# Patient Record
Sex: Female | Born: 1955 | Race: White | Hispanic: No | Marital: Married
Health system: Southern US, Community
[De-identification: ages and names within clinical notes are randomized; demographics above are authoritative.]

---

## 1998-06-28 ENCOUNTER — Other Ambulatory Visit: Admission: RE | Admit: 1998-06-28 | Discharge: 1998-06-28 | Payer: Self-pay | Admitting: Obstetrics & Gynecology

## 1998-12-12 ENCOUNTER — Other Ambulatory Visit: Admission: RE | Admit: 1998-12-12 | Discharge: 1998-12-12 | Payer: Self-pay | Admitting: Obstetrics & Gynecology

## 1999-01-09 ENCOUNTER — Other Ambulatory Visit: Admission: RE | Admit: 1999-01-09 | Discharge: 1999-01-09 | Payer: Self-pay | Admitting: Obstetrics & Gynecology

## 1999-07-04 ENCOUNTER — Other Ambulatory Visit: Admission: RE | Admit: 1999-07-04 | Discharge: 1999-07-04 | Payer: Self-pay | Admitting: Obstetrics & Gynecology

## 2000-07-01 ENCOUNTER — Other Ambulatory Visit: Admission: RE | Admit: 2000-07-01 | Discharge: 2000-07-01 | Payer: Self-pay | Admitting: Obstetrics & Gynecology

## 2001-07-01 ENCOUNTER — Other Ambulatory Visit: Admission: RE | Admit: 2001-07-01 | Discharge: 2001-07-01 | Payer: Self-pay | Admitting: Obstetrics & Gynecology

## 2002-06-30 ENCOUNTER — Other Ambulatory Visit: Admission: RE | Admit: 2002-06-30 | Discharge: 2002-06-30 | Payer: Self-pay | Admitting: Obstetrics & Gynecology

## 2003-07-13 ENCOUNTER — Other Ambulatory Visit: Admission: RE | Admit: 2003-07-13 | Discharge: 2003-07-13 | Payer: Self-pay | Admitting: Obstetrics & Gynecology

## 2004-07-18 ENCOUNTER — Other Ambulatory Visit: Admission: RE | Admit: 2004-07-18 | Discharge: 2004-07-18 | Payer: Self-pay | Admitting: Obstetrics & Gynecology

## 2005-02-12 ENCOUNTER — Ambulatory Visit: Payer: Self-pay | Admitting: Family Medicine

## 2005-05-15 ENCOUNTER — Ambulatory Visit: Payer: Self-pay | Admitting: Family Medicine

## 2005-07-22 ENCOUNTER — Other Ambulatory Visit: Admission: RE | Admit: 2005-07-22 | Discharge: 2005-07-22 | Payer: Self-pay | Admitting: Obstetrics & Gynecology

## 2005-07-30 ENCOUNTER — Encounter: Admission: RE | Admit: 2005-07-30 | Discharge: 2005-07-30 | Payer: Self-pay | Admitting: Obstetrics & Gynecology

## 2005-09-12 ENCOUNTER — Encounter: Admission: RE | Admit: 2005-09-12 | Discharge: 2005-09-12 | Payer: Self-pay | Admitting: Obstetrics & Gynecology

## 2005-10-24 ENCOUNTER — Ambulatory Visit: Payer: Self-pay | Admitting: Family Medicine

## 2007-01-18 ENCOUNTER — Emergency Department (HOSPITAL_COMMUNITY): Admission: EM | Admit: 2007-01-18 | Discharge: 2007-01-18 | Payer: Self-pay | Admitting: Emergency Medicine

## 2007-02-18 ENCOUNTER — Ambulatory Visit: Payer: Self-pay | Admitting: Internal Medicine

## 2007-02-18 LAB — CONVERTED CEMR LAB
BUN: 14 mg/dL (ref 6–23)
Basophils Absolute: 0.1 10*3/uL (ref 0.0–0.1)
Basophils Relative: 0.8 % (ref 0.0–1.0)
CO2: 29 meq/L (ref 19–32)
Calcium: 9.9 mg/dL (ref 8.4–10.5)
Chloride: 107 meq/L (ref 96–112)
Creatinine, Ser: 0.8 mg/dL (ref 0.4–1.2)
Eosinophils Absolute: 0 10*3/uL (ref 0.0–0.6)
Eosinophils Relative: 0.5 % (ref 0.0–5.0)
GFR calc Af Amer: 98 mL/min
GFR calc non Af Amer: 81 mL/min
Glucose, Bld: 104 mg/dL — ABNORMAL HIGH (ref 70–99)
HCT: 39.7 % (ref 36.0–46.0)
Hemoglobin: 13.5 g/dL (ref 12.0–15.0)
Lymphocytes Relative: 24.1 % (ref 12.0–46.0)
MCHC: 33.9 g/dL (ref 30.0–36.0)
MCV: 91.8 fL (ref 78.0–100.0)
Monocytes Absolute: 0.3 10*3/uL (ref 0.2–0.7)
Monocytes Relative: 4.7 % (ref 3.0–11.0)
Neutro Abs: 4.7 10*3/uL (ref 1.4–7.7)
Neutrophils Relative %: 69.9 % (ref 43.0–77.0)
Platelets: 225 10*3/uL (ref 150–400)
Potassium: 3.6 meq/L (ref 3.5–5.1)
Pro B Natriuretic peptide (BNP): 37 pg/mL (ref 0.0–100.0)
RBC: 4.32 M/uL (ref 3.87–5.11)
RDW: 12.7 % (ref 11.5–14.6)
Sodium: 145 meq/L (ref 135–145)
TSH: 1.44 microintl units/mL (ref 0.35–5.50)
WBC: 6.7 10*3/uL (ref 4.5–10.5)

## 2008-12-18 IMAGING — CR DG CHEST 2V
2 series · 2 of 2 positions shown · non-contrast
Comparison: None.

CLINICAL DATA: Chest pain. 
 CHEST - 2 VIEW [DATE]/[id number] HOURS:

[w chest pa]
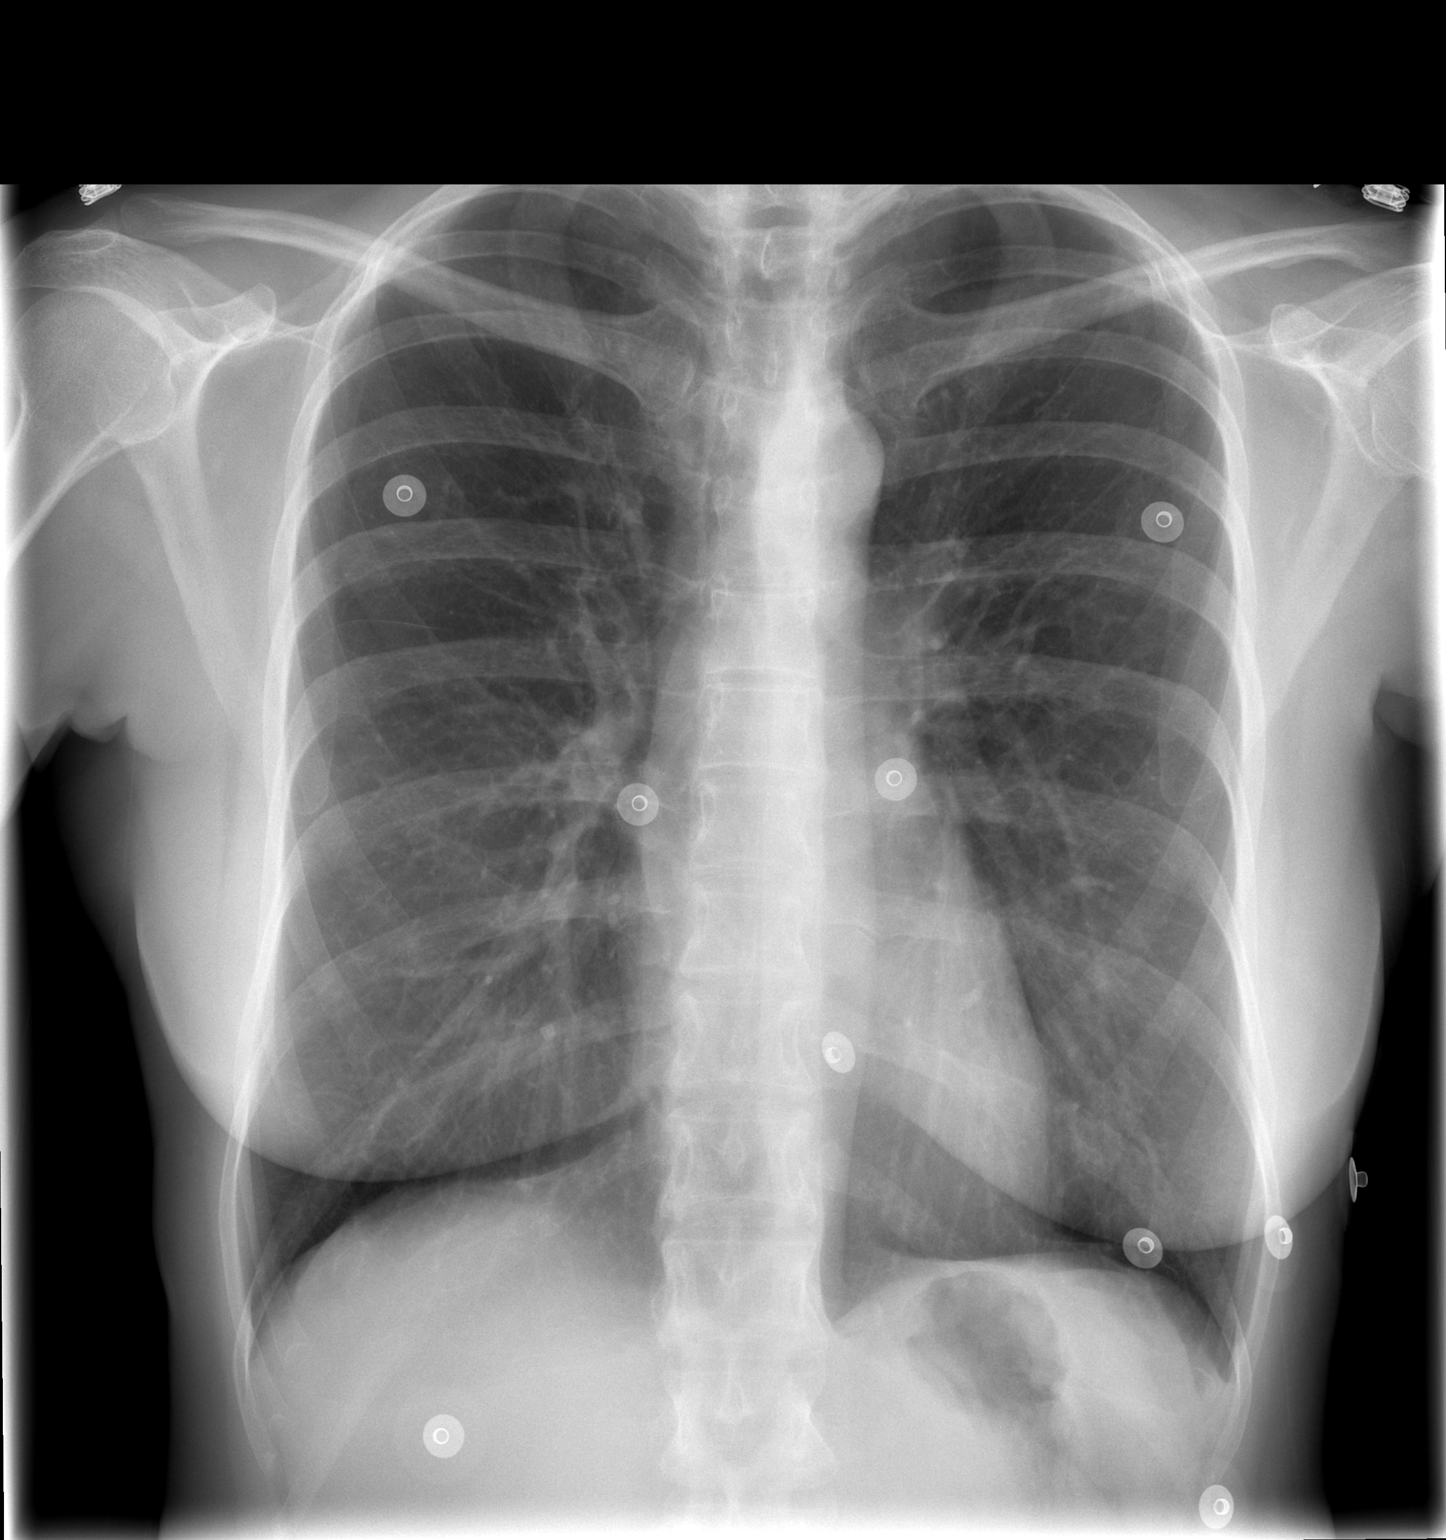

[w chest lat]
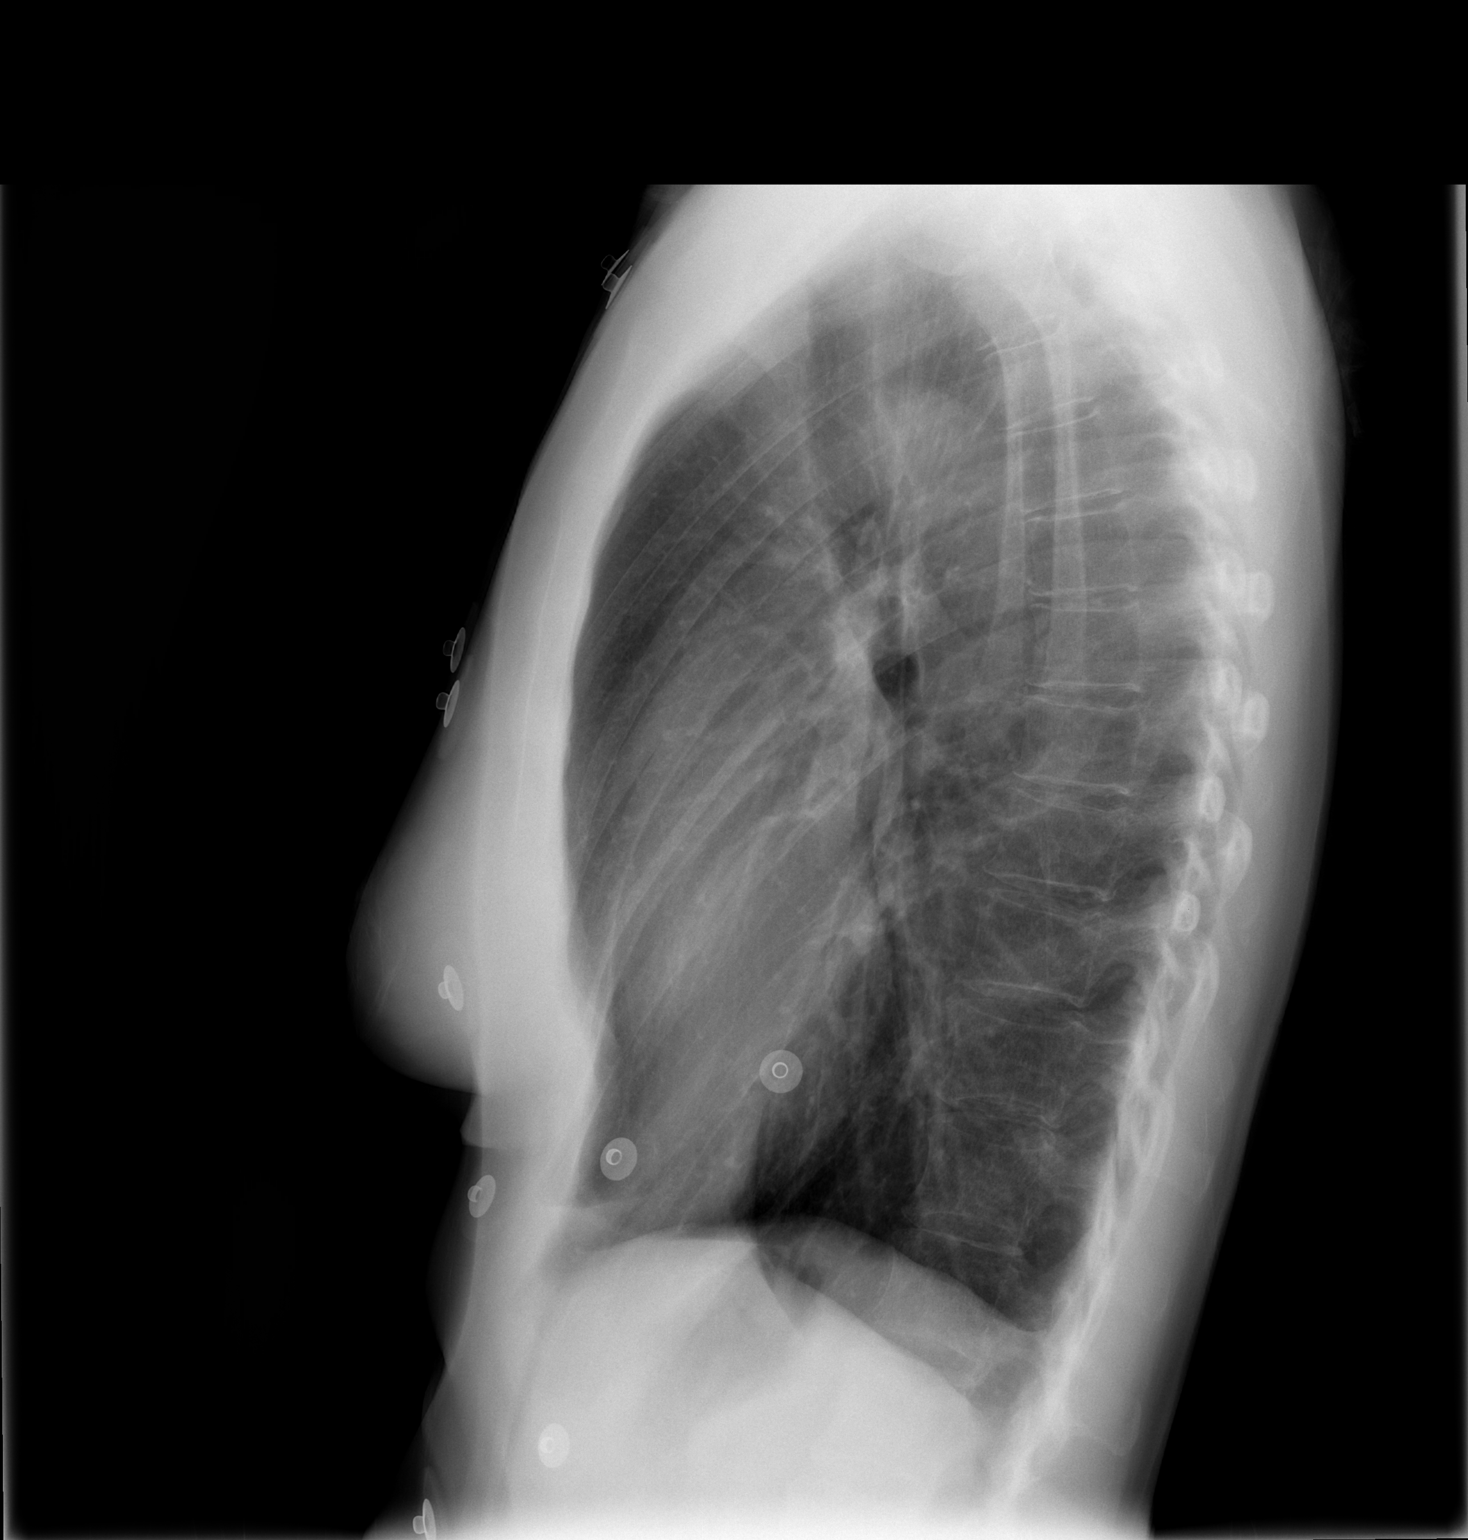

[2 of 2 positions shown; findings below may reference images not displayed]

FINDINGS: The lungs are hyperaerated, a feature of COPD. No pneumothorax, effusion, mass or consolidation is present. The heart is normal in size. The bones are demineralized without compression deformity.
IMPRESSION: Hyperinflation, a feature of COPD.

## 2014-11-02 ENCOUNTER — Ambulatory Visit (INDEPENDENT_AMBULATORY_CARE_PROVIDER_SITE_OTHER): Payer: BC Managed Care – PPO | Admitting: Neurology

## 2014-11-02 ENCOUNTER — Ambulatory Visit (INDEPENDENT_AMBULATORY_CARE_PROVIDER_SITE_OTHER): Payer: Self-pay | Admitting: Neurology

## 2014-11-02 DIAGNOSIS — R202 Paresthesia of skin: Secondary | ICD-10-CM

## 2014-11-02 NOTE — Progress Notes (Signed)
   [facility]  Provider:  Dr Jaynee Eagles Referring Provider: Janie Morning, DO Primary Care Physician:  Janie Morning, DO  History:  Joann Hall is a 57 y.o. female here as a referral from Dr. Theda Sers for evaluation of back pain. The symptoms started with a sharp pain in the left toe and tingling around the toenail. It has progressed to involve the right toes as well. She has numbness in the 4th toe of both feet. Does not have low back pain regularly. She has some radicular symptoms down the front of the lower legs. Symptoms worse with use, she exercises a lot. Balance is worsening, no falls but she has stumbled. Gets cramping in the legs sometimes.She has cold feet.  Notes from provider mentions that patient reported lumbosacral pain with stiffness, numbness, burning and tingling that started 3 years ago. Focused exam demonstrates 5/5 lower extremity strength. Brisk symmetric reflexes.   Summary  Nerve conduction studies were performed on the Bilateral lower extremities: Left peroneal motor nerve showed reduced amplitude (.93 mV, N>2.0) with normal F Wave latency. Right Peroneal motor nerve showed normal conductions with normal F Wave latency Tibial motor nerves showed normal conductions with normal F Wave latencies  Sural and Peroneal sensory nerves were within normal limits Bilateral H Reflexes showed normal latencies  EMG Needle study was performed on selected left upper and bilateral paraspinal muscles:   The left Vastus Medialis, left Anterior Tibialis, left Medial Gastrocnemius, left Extensor Hallucis Longus, left Abductor Hallucis, left Abductor Digiti Quinti,  left Biceps Femoris (long and short heads), left Gluteus Medius, left Gluteus Maximus and Bilateral S1/L5/L4 paraspinal muscles were within normal limits.  Conclusion: This is an abnormal study. The reduced amplitude of the left peroneal motor nerve could be seen with a non-localizing peroneal neuropathy or a  left L5 radiculopathy.  Given normal sensory potentials, favor a root-level lesion.  Sarina Ill, MD  [facility] [address] [place], [place] [address]  Phone [phone] Fax [phone]

## 2014-11-02 NOTE — Progress Notes (Signed)
[facility]   Provider: Dr Jaynee Eagles  Referring Provider: Janie Morning, DO  Primary Care Physician: Janie Morning, DO   History: Joann Hall is a 58 y.o. female here as a referral from Dr. Theda Sers for evaluation of back pain. The symptoms started with a sharp pain in the left toe and tingling around the toenail. It has progressed to involve the right toes as well. She has numbness in the 4th toe of both feet. Does not have low back pain regularly. She has some radicular symptoms down the front of the lower legs. Symptoms worse with use, she exercises a lot. Balance is worsening, no falls but she has stumbled. Gets cramping in the legs sometimes.She has cold feet. Notes from provider mentions that patient reported lumbosacral pain with stiffness, numbness, burning and tingling that started 3 years ago. Focused exam demonstrates 5/5 lower extremity strength. Brisk symmetric reflexes.   Summary   Nerve conduction studies were performed on the Bilateral lower extremities:  Left peroneal motor nerve showed reduced amplitude (.93 mV, N>2.0) with normal F Wave latency.  Right Peroneal motor nerve showed normal conductions with normal F Wave latency  Tibial motor nerves showed normal conductions with normal F Wave latencies  Sural and Peroneal sensory nerves were within normal limits  Bilateral H Reflexes showed normal latencies    EMG Needle study was performed on selected left upper and bilateral paraspinal muscles:  The left Vastus Medialis, left Anterior Tibialis, left Medial Gastrocnemius, left Extensor Hallucis Longus, left Abductor Hallucis, left Abductor Digiti Quinti, left Biceps Femoris (long and short heads), left Gluteus Medius, left Gluteus Maximus and Bilateral S1/L5/L4 paraspinal muscles were within normal limits.    Conclusion: This is an abnormal study. The reduced amplitude of the left peroneal motor nerve could be seen with a non-localizing peroneal neuropathy or a  left L5 radiculopathy. Given normal sensory potentials, favor a root-level lesion.    Sarina Ill, MD  [facility]  [address] [place]  [place], [place] [address]  Phone [phone] Fax [phone]

## 2016-05-06 DIAGNOSIS — F419 Anxiety disorder, unspecified: Secondary | ICD-10-CM | POA: Insufficient documentation

## 2016-05-06 DIAGNOSIS — F410 Panic disorder [episodic paroxysmal anxiety] without agoraphobia: Secondary | ICD-10-CM | POA: Insufficient documentation

## 2016-05-06 DIAGNOSIS — R922 Inconclusive mammogram: Secondary | ICD-10-CM | POA: Insufficient documentation

## 2016-05-06 DIAGNOSIS — F5105 Insomnia due to other mental disorder: Secondary | ICD-10-CM | POA: Insufficient documentation

## 2016-05-06 DIAGNOSIS — M1710 Unilateral primary osteoarthritis, unspecified knee: Secondary | ICD-10-CM | POA: Insufficient documentation

## 2016-05-06 DIAGNOSIS — I10 Essential (primary) hypertension: Secondary | ICD-10-CM | POA: Insufficient documentation

## 2017-04-09 DIAGNOSIS — H6981 Other specified disorders of Eustachian tube, right ear: Secondary | ICD-10-CM | POA: Insufficient documentation

## 2017-09-16 DIAGNOSIS — F331 Major depressive disorder, recurrent, moderate: Secondary | ICD-10-CM | POA: Insufficient documentation

## 2017-09-16 DIAGNOSIS — F419 Anxiety disorder, unspecified: Secondary | ICD-10-CM | POA: Insufficient documentation

## 2018-03-20 ENCOUNTER — Encounter: Payer: Self-pay | Admitting: Gastroenterology

## 2018-06-17 DIAGNOSIS — D72829 Elevated white blood cell count, unspecified: Secondary | ICD-10-CM | POA: Insufficient documentation

## 2018-09-23 DIAGNOSIS — R5382 Chronic fatigue, unspecified: Secondary | ICD-10-CM | POA: Insufficient documentation

## 2019-04-01 DIAGNOSIS — D72819 Decreased white blood cell count, unspecified: Secondary | ICD-10-CM

## 2019-07-23 DIAGNOSIS — L989 Disorder of the skin and subcutaneous tissue, unspecified: Secondary | ICD-10-CM | POA: Insufficient documentation

## 2020-01-13 DIAGNOSIS — Z01419 Encounter for gynecological examination (general) (routine) without abnormal findings: Secondary | ICD-10-CM | POA: Insufficient documentation

## 2020-06-27 DIAGNOSIS — R300 Dysuria: Secondary | ICD-10-CM | POA: Insufficient documentation

## 2020-12-28 DIAGNOSIS — Z0000 Encounter for general adult medical examination without abnormal findings: Secondary | ICD-10-CM | POA: Insufficient documentation

## 2021-06-28 DIAGNOSIS — F419 Anxiety disorder, unspecified: Secondary | ICD-10-CM | POA: Diagnosis not present

## 2021-06-28 DIAGNOSIS — F410 Panic disorder [episodic paroxysmal anxiety] without agoraphobia: Secondary | ICD-10-CM | POA: Diagnosis not present

## 2021-06-28 DIAGNOSIS — F331 Major depressive disorder, recurrent, moderate: Secondary | ICD-10-CM | POA: Diagnosis not present

## 2021-06-28 DIAGNOSIS — F5105 Insomnia due to other mental disorder: Secondary | ICD-10-CM | POA: Diagnosis not present

## 2021-06-28 DIAGNOSIS — I10 Essential (primary) hypertension: Secondary | ICD-10-CM | POA: Diagnosis not present

## 2021-07-31 ENCOUNTER — Ambulatory Visit: Payer: PPO | Admitting: Sports Medicine

## 2021-08-01 ENCOUNTER — Ambulatory Visit: Payer: PPO | Admitting: Sports Medicine

## 2021-08-01 ENCOUNTER — Encounter: Payer: Self-pay | Admitting: Sports Medicine

## 2021-08-01 ENCOUNTER — Other Ambulatory Visit: Payer: Self-pay

## 2021-08-01 DIAGNOSIS — M79672 Pain in left foot: Secondary | ICD-10-CM

## 2021-08-01 DIAGNOSIS — M792 Neuralgia and neuritis, unspecified: Secondary | ICD-10-CM | POA: Diagnosis not present

## 2021-08-01 DIAGNOSIS — M79671 Pain in right foot: Secondary | ICD-10-CM

## 2021-08-01 NOTE — Patient Instructions (Signed)
Nervive nerve relief supplement for your nerves can be purchased OTC at [facility]  

## 2021-08-01 NOTE — Progress Notes (Addendum)
Subjective: KAYDYNCE PAT is a 65 y.o. female patient who presents to office for evaluation of abnormal sensation to the bottoms of both feet feels like she has sandpaper stuck to the bottoms states that her toes are also cramping and this has started since the first week of September states that she also had an issue with dry skin but that improved with moisture with lotion and states that the tingling goes up to her ankles and has slowly started to affect her balance.  Patient currently is very active and does yoga and other exercises but still has difficulty with holding pulses and balance.  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     calcium carbonate (OS-CAL) 600 MG TABS tablet Take 600 mg by mouth 2 (two) times daily with a meal.     Carboxymethylcellul-Glycerin (REFRESH OPTIVE OP) Apply to eye.     clonazePAM (KLONOPIN) 2 MG tablet Take one and one half tablets daily for anxiety     Fexofenadine HCl (WAL-FEX ALLERGY PO) Take by mouth.     fluticasone (FLONASE) 50 MCG/ACT nasal spray Place 2 sprays into both nostrils daily.     losartan (COZAAR) 50 MG tablet Take 1 tablet by mouth daily.     Omega 3 1000 MG CAPS Take by mouth.     psyllium (METAMUCIL) 58.6 % powder Take 1 packet by  mouth 3 (three) times daily.     QUEtiapine (SEROQUEL) 100 MG tablet Take 1 tablet by mouth daily with the 25 mg at night     Melatonin 5 MG CHEW Chew 2 tablets by mouth at bedtime.     No current facility-administered medications on file prior to visit.    No Known Allergies  Objective:  General: Alert and oriented x3 in no acute distress  Dermatology: No open lesions bilateral lower extremities, no webspace macerations, no ecchymosis bilateral, all nails x 10 are well manicured.  Vascular: Dorsalis Pedis and Posterior Tibial pedal pulses faintly palpable, Capillary Fill Time 5 seconds, scant pedal hair growth bilateral, no edema bilateral lower extremities, Temperature gradient decreased slightly bilateral with mild varicosities noted.  Neurology: Johney Maine sensation intact via light touch bilateral, Protective sensation intact with Thornell Mule Monofilament to all pedal sites, vibratory sensation slightly diminished bilateral.  No babinski sign present bilateral.  Subjective awkward feeling of numbness to toes tingling and sandpaper feeling to the bottoms of both feet.  Musculoskeletal: No reproducible tenderness to palpation bilateral however clinically patient does have hammertoe deformity and fat pad atrophy noted plantarly bilateral.  Assessment and Plan: Problem List Items Addressed This Visit   None Visit Diagnoses     Neuritis    -  Primary   Relevant Orders   CBC with Differential/Platelet   Basic Metabolic Panel   Sedimentation rate  C-reactive protein   Rheumatoid factor   ANA   Hemoglobin A1c   HLA-B27 Antigen   Vitamin B12   Thyroid Panel With TSH   Foot pain, bilateral            -Complete examination performed -Discussed treatment options for what sounds like neuritis with the early beginnings of neuropathy possible small versus large fiber upon chart review I do see that there is a note for paresthesias from [facility] from 2015 that  identifies issues at that time with likely lumbar radiculopathy that could be the cause of issues however patient seems to have remotely forgotten about having any previous nerve testing or nerve work-up before -Rx blood work to evaluate the extent of neuropathy -Recommend patient to try over-the-counter Nervive nerve relief -Advised patient to continue with daily stretching and exercises as tolerated encourage mobility however did advise patient to reduce balancing poses that could add to instability and falls -Patient to return to office after blood work or sooner if condition worsens.  Advised patient if blood work is negative may benefit from another referral to the neurologist for additional nerve testing to see if things have progressed since [address] DPM

## 2021-08-09 LAB — THYROID PANEL WITH TSH
Free Thyroxine Index: 1.7 (ref 1.2–4.9)
T3 Uptake Ratio: 27 % (ref 24–39)
T4, Total: 6.3 ug/dL (ref 4.5–12.0)
TSH: 1.16 u[IU]/mL (ref 0.450–4.500)

## 2021-08-09 LAB — CBC WITH DIFFERENTIAL/PLATELET
Basophils Absolute: 0 10*3/uL (ref 0.0–0.2)
Basos: 0 %
EOS (ABSOLUTE): 0 10*3/uL (ref 0.0–0.4)
Eos: 0 %
Hematocrit: 38.8 % (ref 34.0–46.6)
Hemoglobin: 13 g/dL (ref 11.1–15.9)
Immature Grans (Abs): 0 10*3/uL (ref 0.0–0.1)
Immature Granulocytes: 0 %
Lymphocytes Absolute: 1.3 10*3/uL (ref 0.7–3.1)
Lymphs: 29 %
MCH: 31.3 pg (ref 26.6–33.0)
MCHC: 33.5 g/dL (ref 31.5–35.7)
MCV: 93 fL (ref 79–97)
Monocytes Absolute: 0.4 10*3/uL (ref 0.1–0.9)
Monocytes: 8 %
Neutrophils Absolute: 2.9 10*3/uL (ref 1.4–7.0)
Neutrophils: 63 %
Platelets: 205 10*3/uL (ref 150–450)
RBC: 4.16 x10E6/uL (ref 3.77–5.28)
RDW: 12 % (ref 11.7–15.4)
WBC: 4.7 10*3/uL (ref 3.4–10.8)

## 2021-08-09 LAB — BASIC METABOLIC PANEL
BUN/Creatinine Ratio: 14 (ref 12–28)
BUN: 12 mg/dL (ref 8–27)
CO2: 27 mmol/L (ref 20–29)
Calcium: 10.6 mg/dL — ABNORMAL HIGH (ref 8.7–10.3)
Chloride: 99 mmol/L (ref 96–106)
Creatinine, Ser: 0.83 mg/dL (ref 0.57–1.00)
Glucose: 101 mg/dL — ABNORMAL HIGH (ref 70–99)
Potassium: 4.4 mmol/L (ref 3.5–5.2)
Sodium: 141 mmol/L (ref 134–144)
eGFR: 78 mL/min/{1.73_m2} (ref >59)

## 2021-08-09 LAB — HEMOGLOBIN A1C
Est. average glucose Bld gHb Est-mCnc: 111 mg/dL
Hgb A1c MFr Bld: 5.5 % (ref 4.8–5.6)

## 2021-08-09 LAB — VITAMIN B12: Vitamin B-12: 454 pg/mL (ref 232–1245)

## 2021-08-09 LAB — SEDIMENTATION RATE: Sed Rate: 2 mm/hr (ref 0–40)

## 2021-08-09 LAB — HLA-B27 ANTIGEN: HLA B27: NEGATIVE

## 2021-08-09 LAB — ANA: Anti Nuclear Antibody (ANA): NEGATIVE

## 2021-08-09 LAB — RHEUMATOID FACTOR: Rhuematoid fact SerPl-aCnc: <10 IU/mL (ref <14.0)

## 2021-08-09 LAB — C-REACTIVE PROTEIN: CRP: <1 mg/L (ref 0–10)

## 2021-08-10 ENCOUNTER — Telehealth: Payer: Self-pay

## 2021-08-10 NOTE — Telephone Encounter (Signed)
LVM to pt to return our phone call to review blood work and Dr. Recommendations

## 2021-08-10 NOTE — Telephone Encounter (Signed)
-----   Message from [place], [place] sent at 08/09/2021  9:02 AM EDT ----- Will you let patient know that her blood work came back normal. If she is taking the OTC nervive nerve relief supplement and its helping  then she should keep taking it. If she is still having pain we can set up a virtual telephone visit on next week for me to further discuss her symptoms. Thanks Dr. Cannon Kettle

## 2021-08-24 ENCOUNTER — Ambulatory Visit (INDEPENDENT_AMBULATORY_CARE_PROVIDER_SITE_OTHER): Payer: PPO | Admitting: Sports Medicine

## 2021-08-24 DIAGNOSIS — M79672 Pain in left foot: Secondary | ICD-10-CM

## 2021-08-24 DIAGNOSIS — M79671 Pain in right foot: Secondary | ICD-10-CM

## 2021-08-24 DIAGNOSIS — M792 Neuralgia and neuritis, unspecified: Secondary | ICD-10-CM

## 2021-08-24 NOTE — Progress Notes (Signed)
Virtual Visit via Telephone Note  I connected with Joann Hall on 08/24/21 at  5:00 PM EDT by telephone and verified that I am speaking with the correct person using two identifiers.  Location: Patient: Joann Hall, home  Provider: Landis Martins, DPM, [place] office    I discussed the limitations, risks, security and privacy concerns of performing an evaluation and management service by telephone and the availability of in person appointments. I also discussed with the patient that there may be a patient responsible charge related to this service. The patient expressed understanding and agreed to proceed.   History of Present Illness: 65 year old female patient met via telephone visit to discuss blood work results again and to discuss the extent of her neuritis/neuropathy that she is experiencing in both feet.  Patient reports that things are about the same has completed about 3 weeks of the [facility] nerve relief supplement and has not noticed any difference.  Patient denies any other pedal complaints at this time.   Observations/Objective: Physical exam unable to be performed due to telephone nature of visit  Assessment and Plan: Problem List Items Addressed This Visit   None Visit Diagnoses     Neuritis    -  Primary   Foot pain, bilateral          Reviewed with patient previous blood work which is negative Discussed with patient alternative causes of neuritis/neuropathy in great detail  Advised patient that upon reviewing her chart when she was seen at [facility] back in 2015 after having a nerve conduction study they thought that a lot of her nerve symptoms were likely related to L5 radiculopathy; I discussed this finding with the patient and advised her that since all of her test have been negative it may be a good idea to follow-up with her PCP or to go to orthopedic doctor to examine her back further to see if this is a further cause of her  problems.  Suggested patient go to [facility] or [facility] orthopedics. Discussed in great detail with patient good supportive shoes and gave her shoe recommendations of sketchers or new balance of which she can purchase at any major retailers or places like [employer] or [employer]  Follow Up Instructions: As needed   I discussed the assessment and treatment plan with the patient. The patient was provided an opportunity to ask questions and all were answered. The patient agreed with the plan and demonstrated an understanding of the instructions.   The patient was advised to call back or seek an in-person evaluation if the symptoms worsen or if the condition fails to improve as anticipated.  I provided 17 minutes of non-face-to-face time during this encounter.   Landis Martins, DPM

## 2021-08-29 DIAGNOSIS — R200 Anesthesia of skin: Secondary | ICD-10-CM | POA: Diagnosis not present

## 2021-08-29 DIAGNOSIS — M5451 Vertebrogenic low back pain: Secondary | ICD-10-CM | POA: Diagnosis not present

## 2021-09-14 DIAGNOSIS — Z85828 Personal history of other malignant neoplasm of skin: Secondary | ICD-10-CM | POA: Diagnosis not present

## 2021-09-14 DIAGNOSIS — L821 Other seborrheic keratosis: Secondary | ICD-10-CM | POA: Diagnosis not present

## 2021-09-14 DIAGNOSIS — D1801 Hemangioma of skin and subcutaneous tissue: Secondary | ICD-10-CM | POA: Diagnosis not present

## 2024-08-05 ENCOUNTER — Other Ambulatory Visit (HOSPITAL_BASED_OUTPATIENT_CLINIC_OR_DEPARTMENT_OTHER): Payer: Self-pay | Admitting: Nurse Practitioner

## 2024-08-05 DIAGNOSIS — R0789 Other chest pain: Secondary | ICD-10-CM

## 2024-08-17 ENCOUNTER — Ambulatory Visit (INDEPENDENT_AMBULATORY_CARE_PROVIDER_SITE_OTHER)
Admission: RE | Admit: 2024-08-17 | Discharge: 2024-08-17 | Disposition: A | Discharge status: Home / Self Care | Payer: Self-pay | Source: Ambulatory Visit | Attending: Nurse Practitioner | Admitting: Nurse Practitioner

## 2024-08-17 DIAGNOSIS — R0789 Other chest pain: Secondary | ICD-10-CM | POA: Diagnosis not present

## 2024-08-17 MED ORDER — IOHEXOL 300 MG/ML  SOLN
100.0000 mL | Freq: Once | INTRAMUSCULAR | Status: AC | PRN
Start: 2024-08-17 — End: 2024-08-17
  Administered 2024-08-17: 75 mL via INTRAVENOUS
